# Patient Record
Sex: Male | Born: 1980 | Race: Black or African American | Hispanic: No | Marital: Single | Smoking: Current every day smoker
Health system: Southern US, Community
[De-identification: ages and names within clinical notes are randomized; demographics above are authoritative.]

---

## 2014-02-20 ENCOUNTER — Emergency Department (HOSPITAL_BASED_OUTPATIENT_CLINIC_OR_DEPARTMENT_OTHER): Payer: Self-pay

## 2014-02-20 ENCOUNTER — Emergency Department (HOSPITAL_BASED_OUTPATIENT_CLINIC_OR_DEPARTMENT_OTHER)
Admission: EM | Admit: 2014-02-20 | Discharge: 2014-02-20 | Disposition: A | Discharge status: Home / Self Care | Payer: Self-pay | Attending: Emergency Medicine | Admitting: Emergency Medicine

## 2014-02-20 ENCOUNTER — Encounter (HOSPITAL_BASED_OUTPATIENT_CLINIC_OR_DEPARTMENT_OTHER): Payer: Self-pay | Admitting: Emergency Medicine

## 2014-02-20 DIAGNOSIS — J069 Acute upper respiratory infection, unspecified: Secondary | ICD-10-CM | POA: Insufficient documentation

## 2014-02-20 DIAGNOSIS — R059 Cough, unspecified: Secondary | ICD-10-CM | POA: Insufficient documentation

## 2014-02-20 DIAGNOSIS — R05 Cough: Secondary | ICD-10-CM | POA: Insufficient documentation

## 2014-02-20 DIAGNOSIS — R509 Fever, unspecified: Secondary | ICD-10-CM | POA: Insufficient documentation

## 2014-02-20 DIAGNOSIS — F17200 Nicotine dependence, unspecified, uncomplicated: Secondary | ICD-10-CM | POA: Insufficient documentation

## 2014-02-20 MED ORDER — BENZONATATE 100 MG PO CAPS: 100.0000 mg | ORAL_CAPSULE | Freq: Three times a day (TID) | ORAL | Status: AC

## 2014-02-20 MED ORDER — GUAIFENESIN ER 1200 MG PO TB12: 1.0000 | ORAL_TABLET | Freq: Two times a day (BID) | ORAL | Status: AC

## 2014-02-20 NOTE — ED Notes (Signed)
Pt c/o cough productive of green mucous x2 days. Pt thinks he may have been feverish and has had intermittent dizziness.

## 2014-02-20 NOTE — Discharge Instructions (Signed)
Cough, Adult   A cough is a reflex. It helps you clear your throat and airways. A cough can help heal your body. A cough can last 2 or 3 weeks (acute) or may last more than 8 weeks (chronic). Some common causes of a cough can include an infection, allergy, or a cold.  HOME CARE  · Only take medicine as told by your doctor.  · If given, take your medicines (antibiotics) as told. Finish them even if you start to feel better.  · Use a cold steam vaporizer or humidifier in your home. This can help loosen thick spit (secretions).  · Sleep so you are almost sitting up (semi-upright). Use pillows to do this. This helps reduce coughing.  · Rest as needed.  · Stop smoking if you smoke.  GET HELP RIGHT AWAY IF:  · You have yellowish-white fluid (pus) in your thick spit.  · Your cough gets worse.  · Your medicine does not reduce coughing, and you are losing sleep.  · You cough up blood.  · You have trouble breathing.  · Your pain gets worse and medicine does not help.  · You have a fever.  MAKE SURE YOU:   · Understand these instructions.  · Will watch your condition.  · Will get help right away if you are not doing well or get worse.  Document Released: 03/28/2011 Document Revised: 11/29/2013 Document Reviewed: 03/28/2011  [employer]® Patient Information ©2015 [employer]. This information is not intended to replace advice given to you by your health care provider. Make sure you discuss any questions you have with your health care provider.

## 2014-02-20 NOTE — ED Provider Notes (Signed)
CSN: [id number]     Arrival date & time 02/20/14  1259 History   First MD Initiated Contact with Patient 02/20/14 1417     Chief Complaint  Patient presents with  . Cough    Patient is a 33 y.o. male presenting with cough. The history is provided by the patient.  Cough Cough characteristics:  Productive Sputum characteristics:  Nondescript Severity:  Moderate Duration:  2 days Timing:  Intermittent Chronicity:  New Relieved by:  Nothing Ineffective treatments:  Cough suppressants Associated symptoms: chest pain, chills and fever   Associated symptoms: no rash     History reviewed. No pertinent past medical history. History reviewed. No pertinent past surgical history. No family history on file. History  Substance Use Topics  . Smoking status: Current Every Day Smoker  . Smokeless tobacco: Not on file  . Alcohol Use: No    Review of Systems  Constitutional: Positive for fever and chills.  Respiratory: Positive for cough.   Cardiovascular: Positive for chest pain.  Skin: Negative for rash.  All other systems reviewed and are negative.     Allergies  Review of patient's allergies indicates no known allergies.  Home Medications   Prior to Admission medications   Medication Sig Start Date End Date Taking? Authorizing Provider  benzonatate (TESSALON) 100 MG capsule Take 1 capsule (100 mg total) by mouth every 8 (eight) hours. 02/20/14   Linwood DibblesJon Loyalty Brashier, MD  Guaifenesin 1200 MG TB12 Take 1 tablet (1,200 mg total) by mouth 2 (two) times daily at 10 AM and 5 PM. 02/20/14   Linwood DibblesJon Jobie Popp, MD   BP 155/87  Pulse 98  Temp(Src) 98.4 F (36.9 C) (Oral)  Resp 20  Ht 5\' 7"  (1.702 m)  Wt 175 lb (79.379 kg)  BMI 27.40 kg/m2  SpO2 97% Physical Exam  Nursing note and vitals reviewed. Constitutional: He appears well-developed and well-nourished. No distress.  HENT:  Head: Normocephalic and atraumatic.  Right Ear: External ear normal.  Left Ear: External ear normal.  Eyes:  Conjunctivae are normal. Right eye exhibits no discharge. Left eye exhibits no discharge. No scleral icterus.  Neck: Neck supple. No tracheal deviation present.  Cardiovascular: Normal rate, regular rhythm and intact distal pulses.   Pulmonary/Chest: Effort normal and breath sounds normal. No stridor. No respiratory distress. He has no wheezes. He has no rales.  Abdominal: Soft. Bowel sounds are normal. He exhibits no distension. There is no tenderness. There is no rebound and no guarding.  Musculoskeletal: He exhibits no edema and no tenderness.  Neurological: He is alert. He has normal strength. No cranial nerve deficit (no facial droop, extraocular movements intact, no slurred speech) or sensory deficit. He exhibits normal muscle tone. He displays no seizure activity. Coordination normal.  Skin: Skin is warm and dry. No rash noted.  Psychiatric: He has a normal mood and affect.    ED Course  Procedures (including critical care time) Labs Review Labs Reviewed - No data to display  Imaging Review Dg Chest 2 View  02/20/2014   CLINICAL DATA:  Pain post trauma  EXAM: CHEST  2 VIEW  COMPARISON:  July 26, 2011  FINDINGS: Lungs are clear. Heart size and pulmonary vascularity are normal. No pneumothorax. No adenopathy. No bone lesions.  IMPRESSION: No abnormality noted.   Electronically Signed   By: Bretta BangWilliam  Woodruff M.D.   On: 02/20/2014 14:00      MDM   Final diagnoses:  URI, acute    Symptoms are consistent with  a simple upper respiratory infection. There is no evidence to suggest pneumonia on my exam. The patient does not appear to have an otitis media. I discussed supportive treatment. I encouraged followup with the primary care doctor next week if symptoms have not resolved. Warning signs and reasons to return to the emergency room were discussed  Rx guaifenesin and tessalon.  Smoking cessation   Linwood Dibbles, MD 02/20/14 1436

## 2015-08-29 IMAGING — CR DG CHEST 2V
2 series · 2 of 2 positions shown · non-contrast
Comparison: July 26, 2011

CLINICAL DATA: Pain post trauma

EXAM:
CHEST  2 VIEW

[w chest pa]
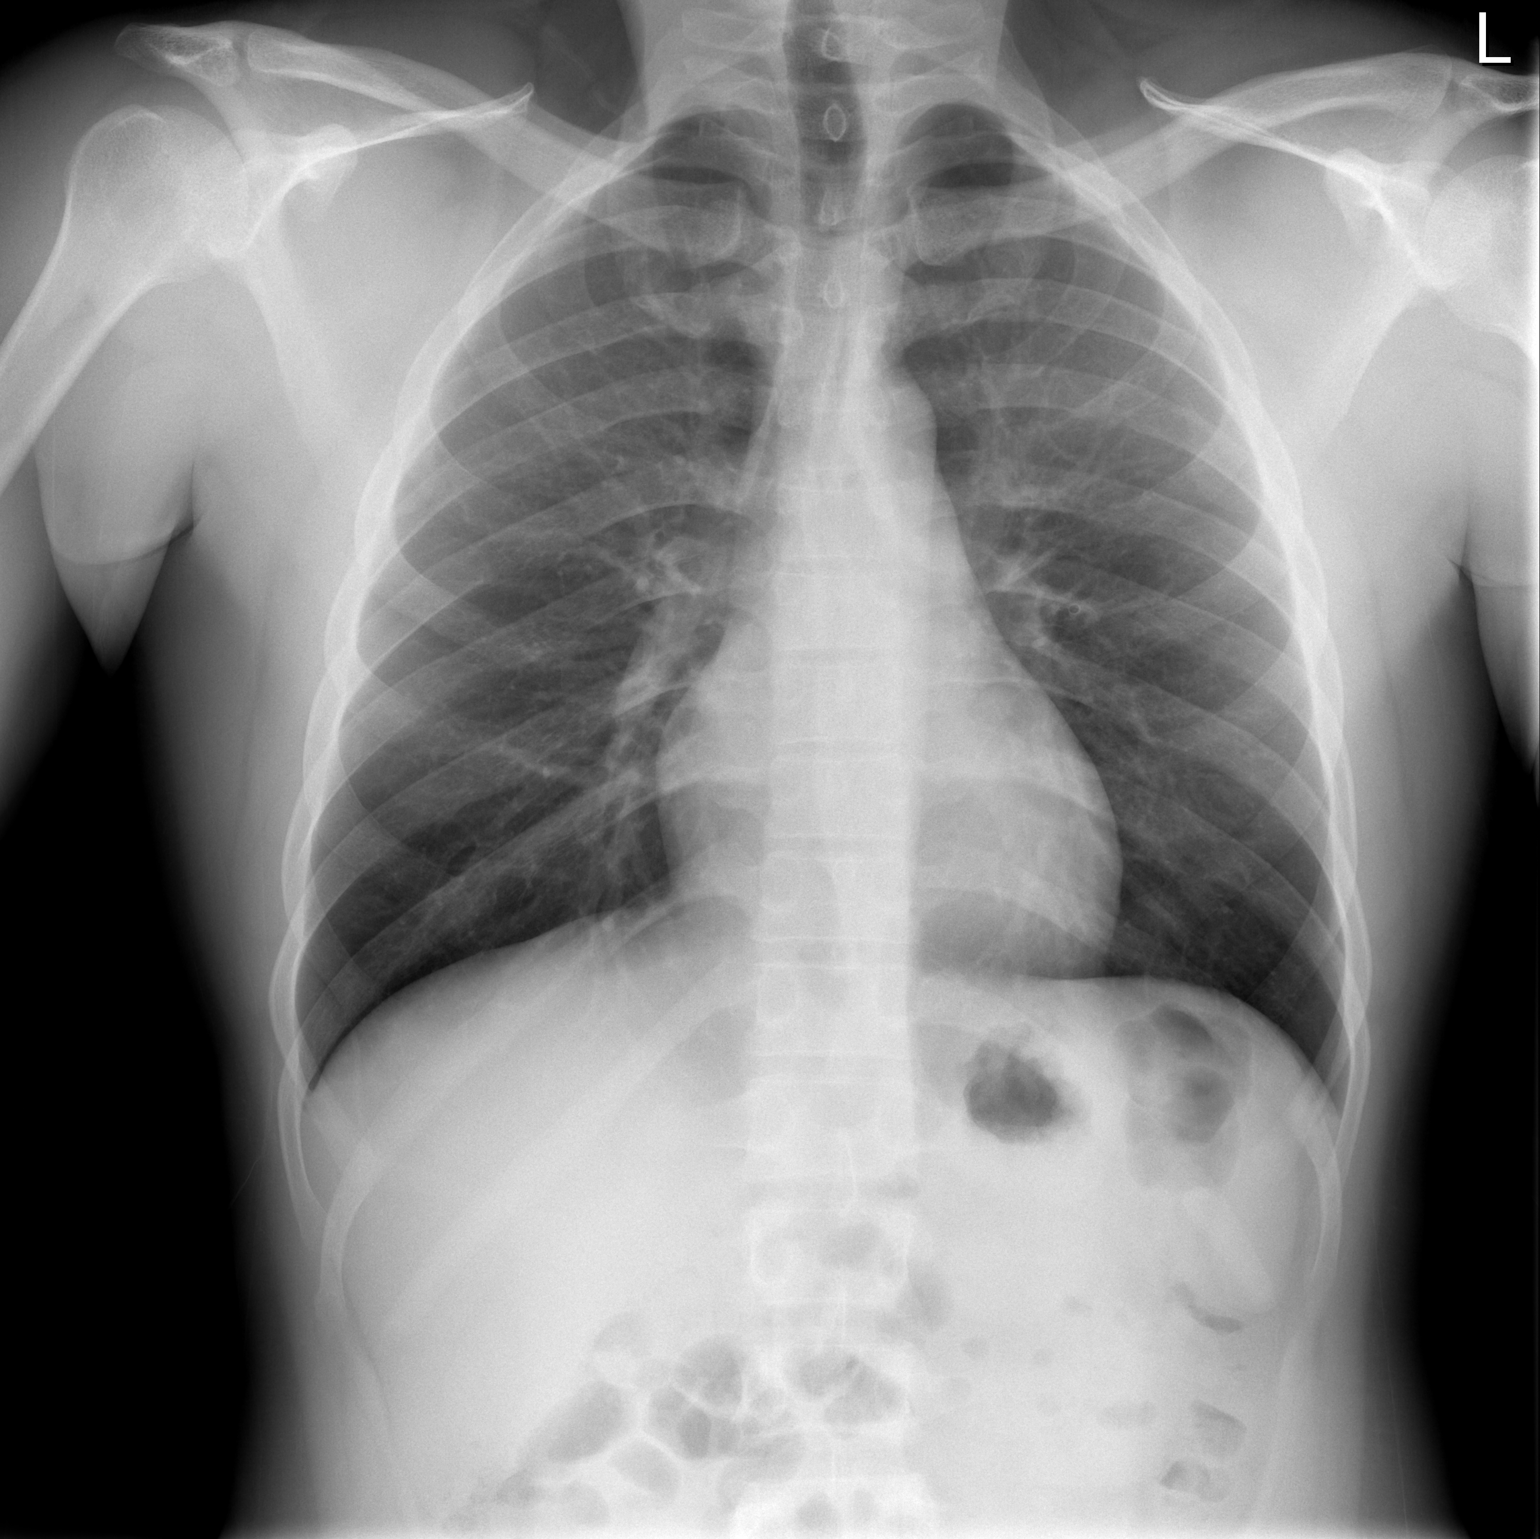

[w chest lat]
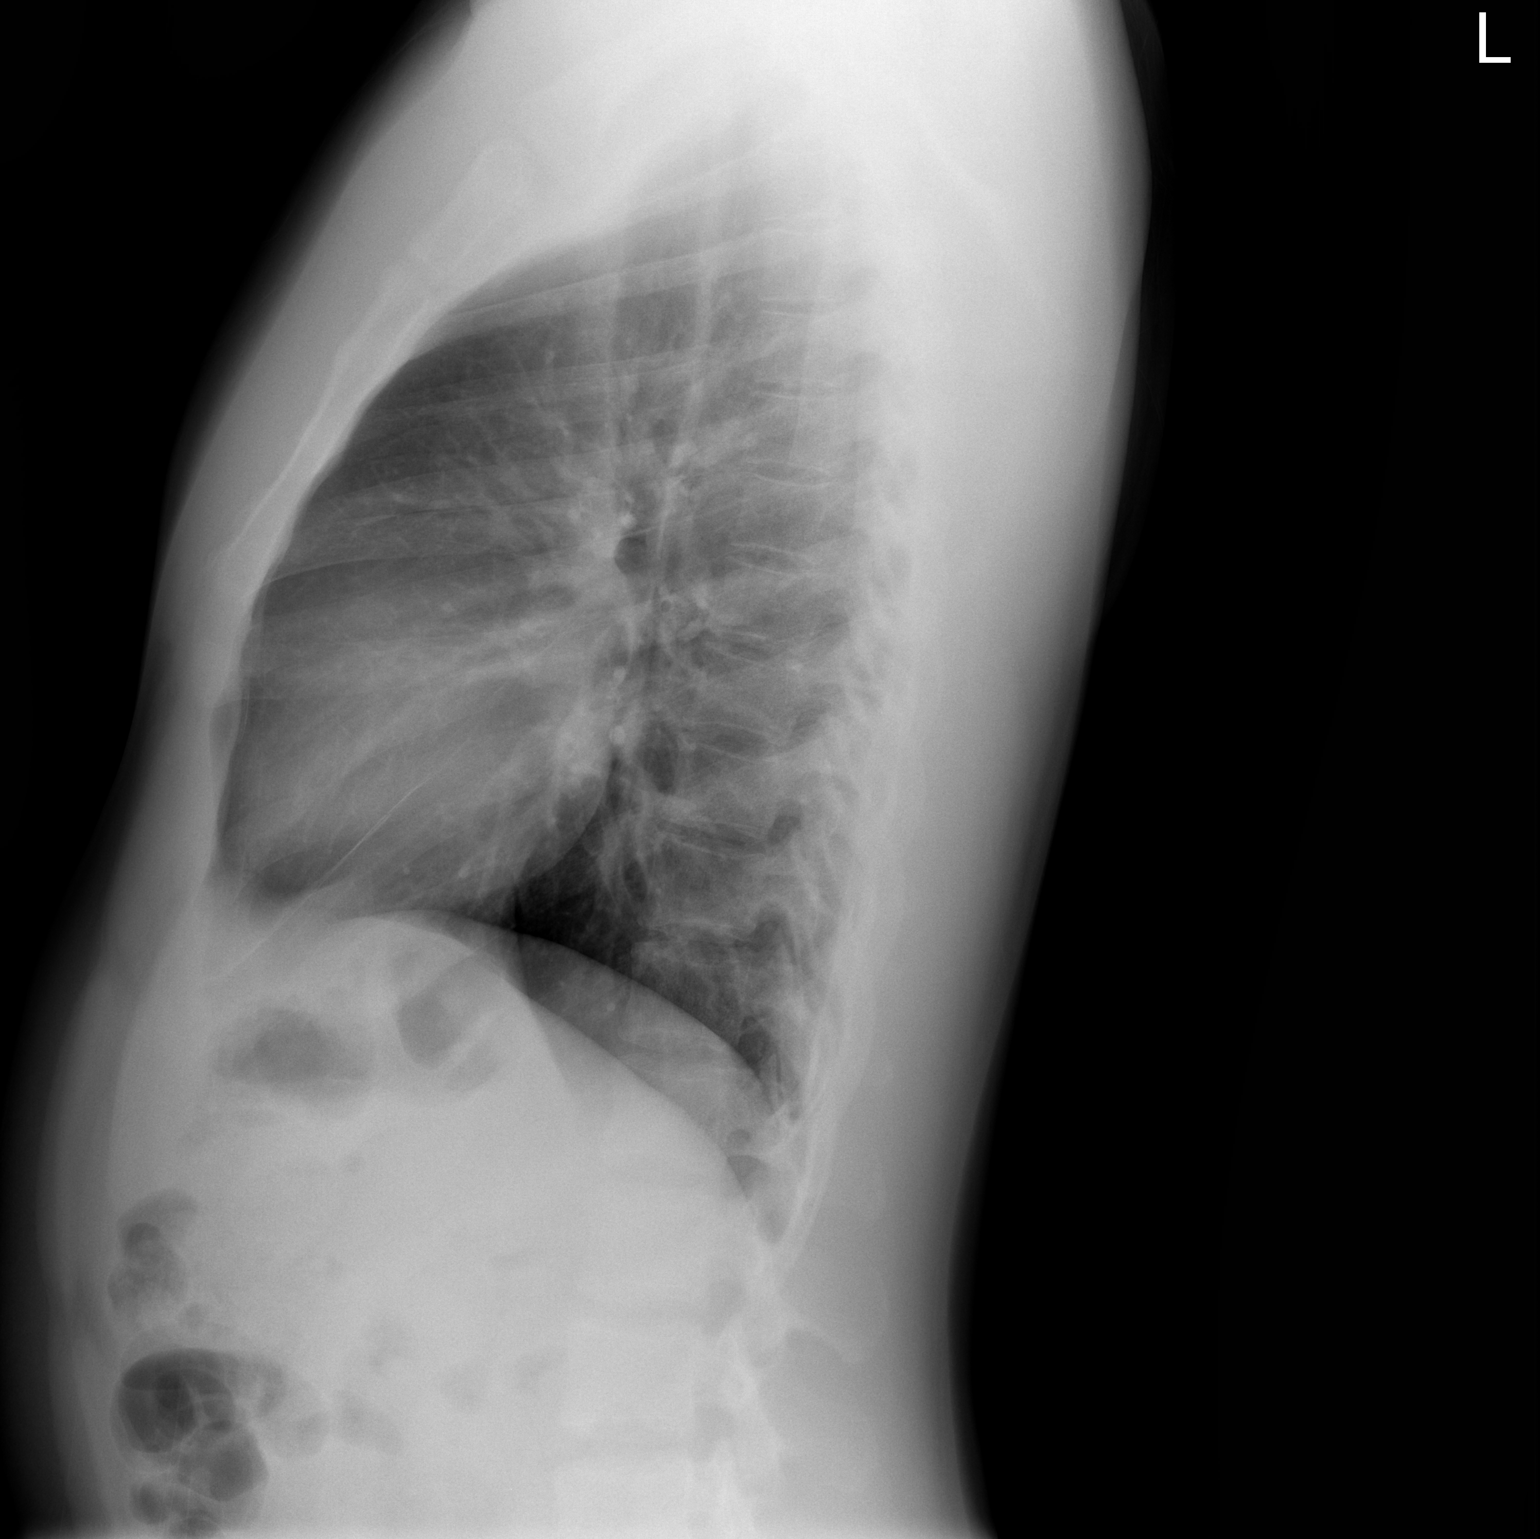

[2 of 2 positions shown; findings below may reference images not displayed]

FINDINGS: Lungs are clear. Heart size and pulmonary vascularity are normal. No
pneumothorax. No adenopathy. No bone lesions.
IMPRESSION: No abnormality noted.

## 2019-08-27 ENCOUNTER — Emergency Department (HOSPITAL_BASED_OUTPATIENT_CLINIC_OR_DEPARTMENT_OTHER)
Admission: EM | Admit: 2019-08-27 | Discharge: 2019-08-27 | Disposition: A | Discharge status: Home / Self Care | Payer: Self-pay | Attending: Emergency Medicine | Admitting: Emergency Medicine

## 2019-08-27 ENCOUNTER — Encounter (HOSPITAL_BASED_OUTPATIENT_CLINIC_OR_DEPARTMENT_OTHER): Payer: Self-pay | Admitting: *Deleted

## 2019-08-27 ENCOUNTER — Other Ambulatory Visit: Payer: Self-pay

## 2019-08-27 DIAGNOSIS — F17210 Nicotine dependence, cigarettes, uncomplicated: Secondary | ICD-10-CM | POA: Insufficient documentation

## 2019-08-27 DIAGNOSIS — Z20822 Contact with and (suspected) exposure to covid-19: Secondary | ICD-10-CM | POA: Insufficient documentation

## 2019-08-27 DIAGNOSIS — B349 Viral infection, unspecified: Secondary | ICD-10-CM | POA: Insufficient documentation

## 2019-08-27 LAB — GROUP A STREP BY PCR: Group A Strep by PCR: NOT DETECTED

## 2019-08-27 NOTE — ED Triage Notes (Signed)
Pt c/o fever , cough , body aches , chills sore throat x 5 days

## 2019-08-27 NOTE — ED Provider Notes (Signed)
[facility] Provider Note   CSN: [id number] Arrival date & time: 08/27/19  1245     History Chief Complaint  Patient presents with  . Fever    [facility] is a 39 y.o. male.  HPI Reports he started getting a sore throat about 5 days ago.  He reports he has a tendency to get strep pretty frequently.  He typically gets at least twice a year.  He reports that he saves some antibiotics from old prescription so that if he starts to get a sore throat, he can start taking some.  He reports that he has been taking antibiotics from leftover prescriptions for about 4 days but his symptoms have not gotten better.  He reports he is gotten body aches and a cough.  No chest pain or shortness of breath.  Get some chills.  He reports he seems to have a loss of appetite because food just does not taste good to him.    History reviewed. No pertinent past medical history.  There are no problems to display for this patient.   History reviewed. No pertinent surgical history.     History reviewed. No pertinent family history.  Social History   Tobacco Use  . Smoking status: Current Every Day Smoker    Packs/day: 1.00  . Smokeless tobacco: Never Used  Substance Use Topics  . Alcohol use: No  . Drug use: Yes    Types: Marijuana    Home Medications Prior to Admission medications   Medication Sig Start Date End Date Taking? Authorizing Provider  benzonatate (TESSALON) 100 MG capsule Take 1 capsule (100 mg total) by mouth every 8 (eight) hours. 02/20/14   Dorie Rank, MD  Guaifenesin 1200 MG TB12 Take 1 tablet (1,200 mg total) by mouth 2 (two) times daily at 10 AM and 5 PM. 02/20/14   Dorie Rank, MD    Allergies    Patient has no known allergies.  Review of Systems   Review of Systems  10 Systems reviewed and are negative for acute change except as noted in the HPI. Physical Exam Updated Vital Signs BP (!) 179/80   Pulse 81   Temp 98.6 F (37 C)  (Oral)   Resp 18   Ht 5\' 6"  (1.676 m)   Wt 72.6 kg   SpO2 98%   BMI 25.82 kg/m   Physical Exam Constitutional:      Appearance: Normal appearance.     Comments: Patient is clinically well in appearance.  He is alert and nontoxic.  No respiratory distress.  Animated and energetic.  HENT:     Nose: Nose normal.     Mouth/Throat:     Comments: Mild erythema bilateral tonsillar pillars.  No exudate or tonsillar enlargement.  Mucous membranes pink and moist. Eyes:     Extraocular Movements: Extraocular movements intact.     Conjunctiva/sclera: Conjunctivae normal.     Pupils: Pupils are equal, round, and reactive to light.  Cardiovascular:     Rate and Rhythm: Normal rate and regular rhythm.  Pulmonary:     Effort: Pulmonary effort is normal.     Breath sounds: Normal breath sounds.     Comments: Occasional dry cough.  Lungs are clear.  No respiratory distress. Abdominal:     General: There is no distension.     Tenderness: There is no abdominal tenderness.  Musculoskeletal:        General: No tenderness. Normal range of motion.  Cervical back: Neck supple.  Lymphadenopathy:     Cervical: No cervical adenopathy.  Skin:    General: Skin is warm and dry.  Neurological:     General: No focal deficit present.     Mental Status: He is alert and oriented to person, place, and time.     Cranial Nerves: No cranial nerve deficit.     Coordination: Coordination normal.  Psychiatric:        Mood and Affect: Mood normal.     ED Results / Procedures / Treatments   Labs (all labs ordered are listed, but only abnormal results are displayed) Labs Reviewed  GROUP A STREP BY PCR  SARS CORONAVIRUS 2 (TAT 6-24 HRS)    EKG None  Radiology No results found.  Procedures Procedures (including critical care time)  Medications Ordered in ED Medications - No data to display  ED Course  I have reviewed the triage vital signs and the nursing notes.  Pertinent labs & imaging  results that were available during my care of the patient were reviewed by me and considered in my medical decision making (see chart for details).    MDM Rules/Calculators/A&P                      Patient presents with generalized symptoms of chills, subjective fever, cough, sore throat.  Symptoms are suspicious for COVID-19.  Patient is clinically well in appearance without hypoxia, respiratory distress or abnormal pulmonary exam.  Will obtain Covid testing with instructions to patient for quarantining and following up on his results.  Patient reports he has frequent episodes of strep throat.  Will obtain a rapid strep to rule out strep.  Patient is stable for continued outpatient management.  He can continue ibuprofen and Tylenol with hydration on outpatient basis.  [facility] was evaluated in [facility] on 08/27/2019 for the symptoms described in the history of present illness. He was evaluated in the context of the global COVID-19 pandemic, which necessitated consideration that the patient might be at risk for infection with the SARS-CoV-2 virus that causes COVID-19. Institutional protocols and algorithms that pertain to the evaluation of patients at risk for COVID-19 are in a state of rapid change based on information released by regulatory bodies including the CDC and federal and state organizations. These policies and algorithms were followed during the patient's care in the ED. Final Clinical Impression(s) / ED Diagnoses Final diagnoses:  Viral illness    Rx / DC Orders ED Discharge Orders    None       Arby Barrette, MD 08/27/19 1452

## 2019-08-27 NOTE — Discharge Instructions (Addendum)
1.  Use your access for your MyChart to follow-up on your Covid test results.  Follow instructions as outlined.

## 2019-08-28 LAB — SARS CORONAVIRUS 2 (TAT 6-24 HRS): SARS Coronavirus 2: NEGATIVE

## 2020-11-25 ENCOUNTER — Emergency Department (HOSPITAL_BASED_OUTPATIENT_CLINIC_OR_DEPARTMENT_OTHER)
Admission: EM | Admit: 2020-11-25 | Discharge: 2020-11-26 | Disposition: A | Discharge status: Home / Self Care | Payer: Self-pay | Attending: Emergency Medicine | Admitting: Emergency Medicine

## 2020-11-25 ENCOUNTER — Other Ambulatory Visit: Payer: Self-pay

## 2020-11-25 ENCOUNTER — Encounter (HOSPITAL_BASED_OUTPATIENT_CLINIC_OR_DEPARTMENT_OTHER): Payer: Self-pay | Admitting: *Deleted

## 2020-11-25 DIAGNOSIS — U071 COVID-19: Secondary | ICD-10-CM | POA: Insufficient documentation

## 2020-11-25 DIAGNOSIS — R0981 Nasal congestion: Secondary | ICD-10-CM | POA: Insufficient documentation

## 2020-11-25 DIAGNOSIS — F17210 Nicotine dependence, cigarettes, uncomplicated: Secondary | ICD-10-CM | POA: Insufficient documentation

## 2020-11-25 DIAGNOSIS — Z28310 Unvaccinated for covid-19: Secondary | ICD-10-CM | POA: Insufficient documentation

## 2020-11-25 MED ORDER — ACETAMINOPHEN 325 MG PO TABS
ORAL_TABLET | ORAL | Status: AC
  Administered 2020-11-25: 650 mg via ORAL
  Filled 2020-11-25: qty 2

## 2020-11-25 MED ORDER — ACETAMINOPHEN 325 MG PO TABS: 650.0000 mg | ORAL_TABLET | Freq: Once | ORAL | Status: AC | PRN

## 2020-11-25 NOTE — ED Triage Notes (Signed)
Pt reports feeling bad since 0800. + home covid test today. Last tylenol was 4 hours ago

## 2020-11-25 NOTE — ED Notes (Signed)
AMA process explained and MSE waiver signed by patient. 

## 2020-11-26 ENCOUNTER — Encounter (HOSPITAL_BASED_OUTPATIENT_CLINIC_OR_DEPARTMENT_OTHER): Payer: Self-pay | Admitting: Emergency Medicine

## 2020-11-26 MED ORDER — IBUPROFEN 800 MG PO TABS
800.0000 mg | ORAL_TABLET | Freq: Once | ORAL | Status: AC
  Administered 2020-11-26: 800 mg via ORAL
  Filled 2020-11-26: qty 1

## 2020-11-26 NOTE — ED Notes (Signed)
States he took a home test approx 2 hours ago and was Covid Positive, at approx 0800hrs having a fever with body aches. Denies Nausea/ Vomiting and or Diarrhea. Having c/o HA at this time

## 2020-11-26 NOTE — Discharge Instructions (Addendum)
Person Under Monitoring Name: [facility]  Location: [address] [place] [address]   Infection Prevention Recommendations for Individuals Confirmed to have, or Being Evaluated for, 2019 Novel Coronavirus (COVID-19) Infection Who Receive Care at Home  Individuals who are confirmed to have, or are being evaluated for, COVID-19 should follow the prevention steps below until a healthcare provider or local or state health department says they can return to normal activities.  Stay home except to get medical care You should restrict activities outside your home, except for getting medical care. Do not go to work, school, or public areas, and do not use public transportation or taxis.  Call ahead before visiting your doctor Before your medical appointment, call the healthcare provider and tell them that you have, or are being evaluated for, COVID-19 infection. This will help the healthcare provider's office take steps to keep other people from getting infected. Ask your healthcare provider to call the local or state health department.  Monitor your symptoms Seek prompt medical attention if your illness is worsening (e.g., difficulty breathing). Before going to your medical appointment, call the healthcare provider and tell them that you have, or are being evaluated for, COVID-19 infection. Ask your healthcare provider to call the local or state health department.  Wear a facemask You should wear a facemask that covers your nose and mouth when you are in the same room with other people and when you visit a healthcare provider. People who live with or visit you should also wear a facemask while they are in the same room with you.  Separate yourself from other people in your home As much as possible, you should stay in a different room from other people in your home. Also, you should use a separate bathroom, if available.  Avoid sharing household items You should not  share dishes, drinking glasses, cups, eating utensils, towels, bedding, or other items with other people in your home. After using these items, you should wash them thoroughly with soap and water.  Cover your coughs and sneezes Cover your mouth and nose with a tissue when you cough or sneeze, or you can cough or sneeze into your sleeve. Throw used tissues in a lined trash can, and immediately wash your hands with soap and water for at least 20 seconds or use an alcohol-based hand rub.  Wash your [employer] your hands often and thoroughly with soap and water for at least 20 seconds. You can use an alcohol-based hand sanitizer if soap and water are not available and if your hands are not visibly dirty. Avoid touching your eyes, nose, and mouth with unwashed hands.   Prevention Steps for Caregivers and Household Members of Individuals Confirmed to have, or Being Evaluated for, COVID-19 Infection Being Cared for in the Home  If you live with, or provide care at home for, a person confirmed to have, or being evaluated for, COVID-19 infection please follow these guidelines to prevent infection:  Follow healthcare provider's instructions Make sure that you understand and can help the patient follow any healthcare provider instructions for all care.  Provide for the patient's basic needs You should help the patient with basic needs in the home and provide support for getting groceries, prescriptions, and other personal needs.  Monitor the patient's symptoms If they are getting sicker, call his or her medical provider and tell them that the patient has, or is being evaluated for, COVID-19 infection. This will help the healthcare provider's office  take steps to keep other people from getting infected. Ask the healthcare provider to call the local or state health department.  Limit the number of people who have contact with the patient If possible, have only one caregiver for the  patient. Other household members should stay in another home or place of residence. If this is not possible, they should stay in another room, or be separated from the patient as much as possible. Use a separate bathroom, if available. Restrict visitors who do not have an essential need to be in the home.  Keep older adults, very young children, and other sick people away from the patient Keep older adults, very young children, and those who have compromised immune systems or chronic health conditions away from the patient. This includes people with chronic heart, lung, or kidney conditions, diabetes, and cancer.  Ensure good ventilation Make sure that shared spaces in the home have good air flow, such as from an air conditioner or an opened window, weather permitting.  Wash your hands often Wash your hands often and thoroughly with soap and water for at least 20 seconds. You can use an alcohol based hand sanitizer if soap and water are not available and if your hands are not visibly dirty. Avoid touching your eyes, nose, and mouth with unwashed hands. Use disposable paper towels to dry your hands. If not available, use dedicated cloth towels and replace them when they become wet.  Wear a facemask and gloves Wear a disposable facemask at all times in the room and gloves when you touch or have contact with the patient's blood, body fluids, and/or secretions or excretions, such as sweat, saliva, sputum, nasal mucus, vomit, urine, or feces.  Ensure the mask fits over your nose and mouth tightly, and do not touch it during use. Throw out disposable facemasks and gloves after using them. Do not reuse. Wash your hands immediately after removing your facemask and gloves. If your personal clothing becomes contaminated, carefully remove clothing and launder. Wash your hands after handling contaminated clothing. Place all used disposable facemasks, gloves, and other waste in a lined container before  disposing them with other household waste. Remove gloves and wash your hands immediately after handling these items.  Do not share dishes, glasses, or other household items with the patient Avoid sharing household items. You should not share dishes, drinking glasses, cups, eating utensils, towels, bedding, or other items with a patient who is confirmed to have, or being evaluated for, COVID-19 infection. After the person uses these items, you should wash them thoroughly with soap and water.  Wash laundry thoroughly Immediately remove and wash clothes or bedding that have blood, body fluids, and/or secretions or excretions, such as sweat, saliva, sputum, nasal mucus, vomit, urine, or feces, on them. Wear gloves when handling laundry from the patient. Read and follow directions on labels of laundry or clothing items and detergent. In general, wash and dry with the warmest temperatures recommended on the label.  Clean all areas the individual has used often Clean all touchable surfaces, such as counters, tabletops, doorknobs, bathroom fixtures, toilets, phones, keyboards, tablets, and bedside tables, every day. Also, clean any surfaces that may have blood, body fluids, and/or secretions or excretions on them. Wear gloves when cleaning surfaces the patient has come in contact with. Use a diluted bleach solution (e.g., dilute bleach with 1 part bleach and 10 parts water) or a household disinfectant with a label that says EPA-registered for coronaviruses. To make a bleach  solution at home, add 1 tablespoon of bleach to 1 quart (4 cups) of water. For a larger supply, add  cup of bleach to 1 gallon (16 cups) of water. Read labels of cleaning products and follow recommendations provided on product labels. Labels contain instructions for safe and effective use of the cleaning product including precautions you should take when applying the product, such as wearing gloves or eye protection and making sure you  have good ventilation during use of the product. Remove gloves and wash hands immediately after cleaning.  Monitor yourself for signs and symptoms of illness Caregivers and household members are considered close contacts, should monitor their health, and will be asked to limit movement outside of the home to the extent possible. Follow the monitoring steps for close contacts listed on the symptom monitoring form.   ? If you have additional questions, contact your local health department or call the epidemiologist on call at [phone] (available 24/7). ? This guidance is subject to change. For the most up-to-date guidance from [facility], please refer to their website: [email or web address]

## 2020-11-26 NOTE — ED Provider Notes (Signed)
[facility] EMERGENCY DEPARTMENT Provider Note   CSN: [id number] Arrival date & time: 11/25/20  2235     History Chief Complaint  Patient presents with  . Fever    Covid +    Cody Estes is a 40 y.o. male.  The history is provided by the patient.  Fever Max temp prior to arrival:  102 Temp source:  Oral Severity:  Moderate Onset quality:  Gradual Duration:  1 day Timing:  Constant Progression:  Unchanged Chronicity:  New Relieved by:  Nothing Worsened by:  Nothing Ineffective treatments:  None tried Associated symptoms: congestion and myalgias   Associated symptoms: no chest pain, no chills, no confusion, no cough, no diarrhea, no dysuria, no ear pain, no headaches, no nausea, no rash, no rhinorrhea, no somnolence, no sore throat and no vomiting   Risk factors: no immunosuppression   tested positive for covid and now has a fever and body aches and is unvaccinated.       History reviewed. No pertinent past medical history.  There are no problems to display for this patient.   History reviewed. No pertinent surgical history.     History reviewed. No pertinent family history.  Social History   Tobacco Use  . Smoking status: Current Every Day Smoker    Packs/day: 1.00    Types: Cigarettes  . Smokeless tobacco: Never Used  Substance Use Topics  . Alcohol use: Yes  . Drug use: Yes    Types: Marijuana    Home Medications Prior to Admission medications   Medication Sig Start Date End Date Taking? Authorizing Provider  benzonatate (TESSALON) 100 MG capsule Take 1 capsule (100 mg total) by mouth every 8 (eight) hours. 02/20/14   Linwood Dibbles, MD  Guaifenesin 1200 MG TB12 Take 1 tablet (1,200 mg total) by mouth 2 (two) times daily at 10 AM and 5 PM. 02/20/14   Linwood Dibbles, MD    Allergies    Patient has no known allergies.  Review of Systems   Review of Systems  Constitutional: Positive for fever. Negative for chills.  HENT: Positive for  congestion. Negative for ear pain, rhinorrhea and sore throat.   Eyes: Negative for visual disturbance.  Respiratory: Negative for cough.   Cardiovascular: Negative for chest pain.  Gastrointestinal: Negative for diarrhea, nausea and vomiting.  Genitourinary: Negative for dysuria.  Musculoskeletal: Positive for myalgias.  Skin: Negative for rash.  Neurological: Negative for headaches.  Psychiatric/Behavioral: Negative for confusion.    Physical Exam Updated Vital Signs BP (!) 144/89 (BP Location: Right Arm)   Pulse 98   Temp (!) 102.5 F (39.2 C) (Oral)   Resp 20   Ht 5\' 7"  (1.702 m)   Wt 72.6 kg   SpO2 97%   BMI 25.06 kg/m   Physical Exam Vitals and nursing note reviewed.  Constitutional:      General: He is not in acute distress.    Appearance: Normal appearance.  HENT:     Head: Normocephalic and atraumatic.     Nose: Nose normal.  Eyes:     Conjunctiva/sclera: Conjunctivae normal.     Pupils: Pupils are equal, round, and reactive to light.  Cardiovascular:     Rate and Rhythm: Normal rate and regular rhythm.     Pulses: Normal pulses.     Heart sounds: Normal heart sounds.  Pulmonary:     Effort: Pulmonary effort is normal.     Breath sounds: Normal breath sounds.  Abdominal:  General: Abdomen is flat. Bowel sounds are normal.     Palpations: Abdomen is soft.     Tenderness: There is no abdominal tenderness. There is no guarding.  Musculoskeletal:        General: Normal range of motion.     Cervical back: Normal range of motion and neck supple.  Skin:    General: Skin is warm and dry.     Capillary Refill: Capillary refill takes less than 2 seconds.  Neurological:     General: No focal deficit present.     Mental Status: He is alert and oriented to person, place, and time.     Deep Tendon Reflexes: Reflexes normal.  Psychiatric:        Mood and Affect: Mood normal.        Behavior: Behavior normal.     ED Results / Procedures / Treatments    Labs (all labs ordered are listed, but only abnormal results are displayed) Labs Reviewed - No data to display  EKG None  Radiology No results found.  Procedures Procedures   Medications Ordered in ED Medications  acetaminophen (TYLENOL) tablet 650 mg (650 mg Oral Given 11/25/20 2316)  ibuprofen (ADVIL) tablet 800 mg (800 mg Oral Given 11/26/20 0014)    ED Course  I have reviewed the triage vital signs and the nursing notes.  Pertinent labs & imaging results that were available during my care of the patient were reviewed by me and considered in my medical decision making (see chart for details).   Given ibuprofen for fever control.  Alternate tylenol and ibuprofen.  Check oxygen saturations at home.  Return for oxygen less than 90%. Work note given.  Strict home quarantine precautions given.     Cody Estes was evaluated in Emergency Department on 11/26/2020 for the symptoms described in the history of present illness. He was evaluated in the context of the global COVID-19 pandemic, which necessitated consideration that the patient might be at risk for infection with the SARS-CoV-2 virus that causes COVID-19. Institutional protocols and algorithms that pertain to the evaluation of patients at risk for COVID-19 are in a state of rapid change based on information released by regulatory bodies including the CDC and federal and state organizations. These policies and algorithms were followed during the patient's care in the ED.  Final Clinical Impression(s) / ED Diagnoses Return for intractable cough, coughing up blood, fevers >100.4 unrelieved by medication, shortness of breath, intractable vomiting, chest pain, shortness of breath, weakness, numbness, changes in speech, facial asymmetry, abdominal pain, passing out, Inability to tolerate liquids or food, cough, altered mental status or any concerns. No signs of systemic illness or infection. The patient is nontoxic-appearing on  exam and vital signs are within normal limits.  I have reviewed the triage vital signs and the nursing notes. Pertinent labs & imaging results that were available during my care of the patient were reviewed by me and considered in my medical decision making (see chart for details). After history, exam, and medical workup I feel the patient has been appropriately medically screened and is safe for discharge home. Pertinent diagnoses were discussed with the patient. Patient was given return precautions.   Breely Panik, MD 11/26/20 2620
